# Patient Record
Sex: Female | Born: 1995 | Race: White | Hispanic: No | Marital: Single | Smoking: Never smoker
Health system: Southern US, Community
[De-identification: ages and names within clinical notes are randomized; demographics above are authoritative.]

## PROBLEM LIST (undated history)

## (undated) DIAGNOSIS — E669 Obesity, unspecified: Secondary | ICD-10-CM

## (undated) DIAGNOSIS — R4586 Emotional lability: Secondary | ICD-10-CM

## (undated) DIAGNOSIS — O1203 Gestational edema, third trimester: Secondary | ICD-10-CM

## (undated) DIAGNOSIS — K219 Gastro-esophageal reflux disease without esophagitis: Secondary | ICD-10-CM

## (undated) HISTORY — DX: Gestational edema, third trimester: O12.03

## (undated) HISTORY — DX: Obesity, unspecified: E66.9

## (undated) HISTORY — DX: Emotional lability: R45.86

---
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
## 2015-12-31 ENCOUNTER — Ambulatory Visit
Admission: EM | Admit: 2015-12-31 | Discharge: 2015-12-31 | Disposition: A | Discharge status: Home / Self Care | Payer: Medicaid Other | Attending: Family Medicine | Admitting: Family Medicine

## 2015-12-31 ENCOUNTER — Encounter: Payer: Self-pay | Admitting: *Deleted

## 2015-12-31 DIAGNOSIS — K529 Noninfective gastroenteritis and colitis, unspecified: Secondary | ICD-10-CM | POA: Insufficient documentation

## 2015-12-31 DIAGNOSIS — Z8719 Personal history of other diseases of the digestive system: Secondary | ICD-10-CM

## 2015-12-31 LAB — URINALYSIS COMPLETE WITH MICROSCOPIC (ARMC ONLY)
BILIRUBIN URINE: NEGATIVE
GLUCOSE, UA: NEGATIVE mg/dL
KETONES UR: NEGATIVE mg/dL
LEUKOCYTES UA: NEGATIVE
NITRITE: NEGATIVE
Protein, ur: 30 mg/dL — AB
Specific Gravity, Urine: >1.03 — ABNORMAL HIGH (ref 1.005–1.030)
pH: 5 (ref 5.0–8.0)

## 2015-12-31 MED ORDER — PANTOPRAZOLE SODIUM 20 MG PO TBEC: 20.0000 mg | DELAYED_RELEASE_TABLET | Freq: Every day | ORAL | 0 refills | Status: DC

## 2015-12-31 MED ORDER — ONDANSETRON 8 MG PO TBDP: 8.0000 mg | ORAL_TABLET | Freq: Two times a day (BID) | ORAL | 0 refills | Status: DC

## 2015-12-31 NOTE — ED Triage Notes (Signed)
Hematuria, cough, weakness, N/V,onset today.

## 2015-12-31 NOTE — ED Provider Notes (Signed)
CSN: [id number]     Arrival date & time 12/31/15  1640 History   First MD Initiated Contact with Patient 12/31/15 1712     Chief Complaint  Patient presents with  . Nausea  . Emesis  . Weakness  . Cough  . Hematuria   (Consider location/radiation/quality/duration/timing/severity/associated sxs/prior Treatment) HPI  This a 20 year old female who presents with weakness and nausea and vomiting that started yesterday. She states that she's had 2 episodes of vomiting and diarrhea. The diarrhea has been watery but has had no mucus or blood. Date she has been feeling weak. Cough is been nonproductive. She is also had 2 episodes of some breakthrough menstrual bleeding. She had finished her period last week and noticed some bleeding on her panties and also after wiping herself when she urinated. She states that her 64-year-old son who attends a babysitter on a daily basis with the 2 other children at been sick last week with the diarrhea and vomiting.       History reviewed. No pertinent past medical history. Past Surgical History:  Procedure Laterality Date  . APPENDECTOMY     Family History  Problem Relation Age of Onset  . Cancer Mother   . Seizures Mother    Social History  Substance Use Topics  . Smoking status: Never Smoker  . Smokeless tobacco: Never Used  . Alcohol use No   OB History    No data available     Review of Systems  Constitutional: Positive for activity change and appetite change. Negative for chills, fatigue and fever.  Respiratory: Positive for cough. Negative for shortness of breath.   Gastrointestinal: Positive for diarrhea, nausea and vomiting. Negative for abdominal distention, abdominal pain, anal bleeding, blood in stool, constipation and rectal pain.  Genitourinary: Positive for menstrual problem.  All other systems reviewed and are negative.   Allergies  Review of patient's allergies indicates no known allergies.  Home Medications   Prior to  Admission medications   Medication Sig Start Date End Date Taking? Authorizing Provider  ondansetron (ZOFRAN ODT) 8 MG disintegrating tablet Take 1 tablet (8 mg total) by mouth 2 (two) times daily. For nausea 12/31/15   Lutricia Feil, PA-C  pantoprazole (PROTONIX) 20 MG tablet Take 1 tablet (20 mg total) by mouth daily. 12/31/15   Lutricia Feil, PA-C   Meds Ordered and Administered this Visit  Medications - No data to display  BP 124/80 (BP Location: Left Arm)   Pulse 85   Temp 98.9 F (37.2 C) (Oral)   Resp 16   Ht 5\' 5"  (1.651 m)   Wt 210 lb (95.3 kg)   LMP 12/23/2015   SpO2 97%   BMI 34.95 kg/m  No data found.   Physical Exam  Constitutional: She is oriented to person, place, and time. She appears well-developed and well-nourished. No distress.  HENT:  Head: Normocephalic and atraumatic.  Right Ear: External ear normal.  Left Ear: External ear normal.  Nose: Nose normal.  Mouth/Throat: Oropharynx is clear and moist. No oropharyngeal exudate.  Eyes: EOM are normal. Pupils are equal, round, and reactive to light. Right eye exhibits no discharge. Left eye exhibits no discharge.  Neck: Normal range of motion. Neck supple.  Pulmonary/Chest: Effort normal and breath sounds normal. No respiratory distress. She has no wheezes. She has no rales.  Abdominal: Soft. Bowel sounds are normal. She exhibits no distension and no mass. There is no tenderness. There is no rebound and no guarding.  Musculoskeletal: Normal range of motion.  Lymphadenopathy:    She has no cervical adenopathy.  Neurological: She is alert and oriented to person, place, and time.  Skin: Skin is warm and dry. She is not diaphoretic.  Psychiatric: She has a normal mood and affect. Her behavior is normal. Judgment and thought content normal.  Nursing note and vitals reviewed.   Urgent Care Course   Clinical Course    Procedures (including critical care time)  Labs Review Labs Reviewed  URINALYSIS  COMPLETEWITH MICROSCOPIC ([facility] ONLY) - Abnormal; Notable for the following:       Result Value   APPearance HAZY (*)    Specific Gravity, Urine >1.030 (*)    Hgb urine dipstick TRACE (*)    Protein, ur 30 (*)    Bacteria, UA FEW (*)    Squamous Epithelial / LPF 0-5 (*)    All other components within normal limits    Imaging Review No results found.   Visual Acuity Review  Right Eye Distance:   Left Eye Distance:   Bilateral Distance:    Right Eye Near:   Left Eye Near:    Bilateral Near:         MDM   1. Gastroenteritis, acute   2. History of gastroesophageal reflux (GERD)    Discharge Medication List as of 12/31/2015  5:51 PM    START taking these medications   Details  ondansetron (ZOFRAN ODT) 8 MG disintegrating tablet Take 1 tablet (8 mg total) by mouth 2 (two) times daily. For nausea, Starting Tue 12/31/2015, Normal    pantoprazole (PROTONIX) 20 MG tablet Take 1 tablet (20 mg total) by mouth daily., Starting Tue 12/31/2015, Normal      Plan: 1. Test/x-ray results and diagnosis reviewed with patient 2. rx as per orders; risks, benefits, potential side effects reviewed with patient 3. Recommend supportive treatment with Hydration sufficient to keep her urine clear to a pale yellow. Will prescribed a Zofran to help with nausea. For her spotting I have advised her to watch and see if it clears up. If it doesn't she should go to the GYN clinic that she had attended for the birth of her child. When She does return to eating she should begin with a brat diet. Instructions were given to the patient. If she is not improving in a week or so she should be seen by her primary care physician 4. F/u prn if symptoms worsen or don't improve     Lutricia FeilWilliam P Jeni Duling, PA-C 12/31/15 1820

## 2016-05-08 IMAGING — CT CT ABD-PELV W/ CM
2 of 4 series · 16 of 46 positions shown, 18 images · IV contrast (isovue)
Comparison: None.

CLINICAL DATA: Right lower quadrant pain, anorexia

EXAM:
CT ABDOMEN AND PELVIS WITH CONTRAST
TECHNIQUE: Multidetector CT imaging of the abdomen and pelvis was performed
using the standard protocol following bolus administration of
intravenous contrast.
CONTRAST:  100 cc Isovue

[Series 2: routine abd pel with · axial · 0.71mm/px · z∈[-1150,-700]mm · 13 of 98 slices shown, 15 images]
[im 4/98  soft-tissue]
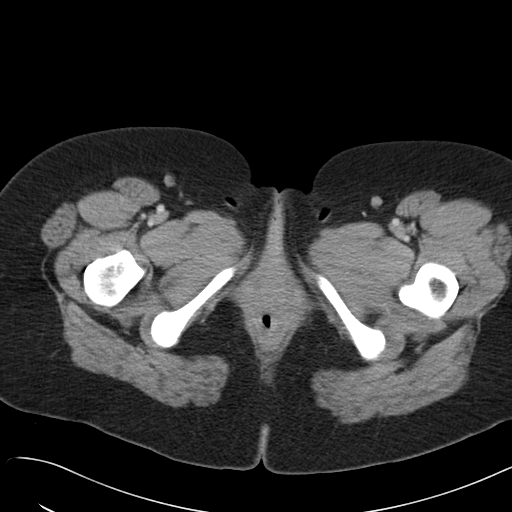
[im 4/98  bone]
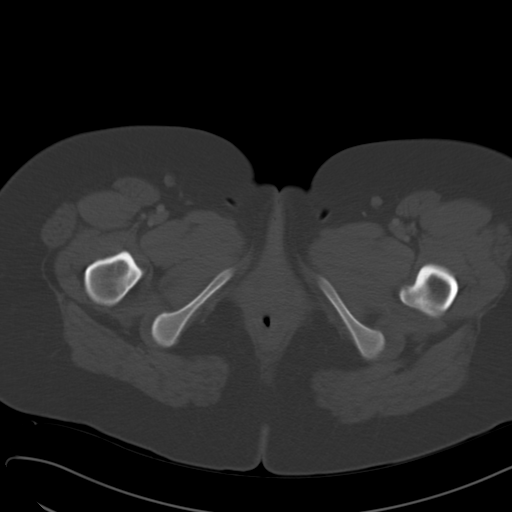
[im 12/98  soft-tissue]
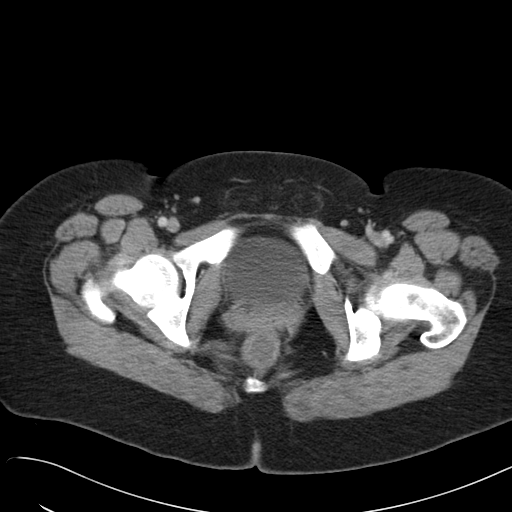
[im 19/98  soft-tissue]
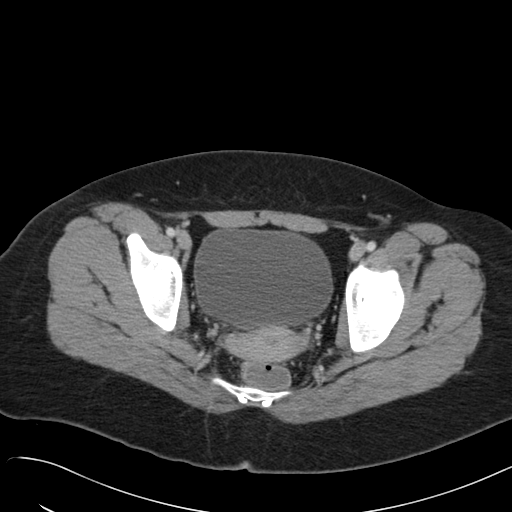
[im 27/98  soft-tissue]
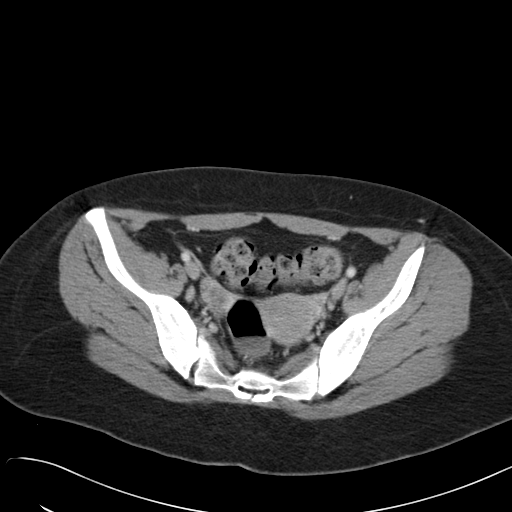
[im 34/98  soft-tissue]
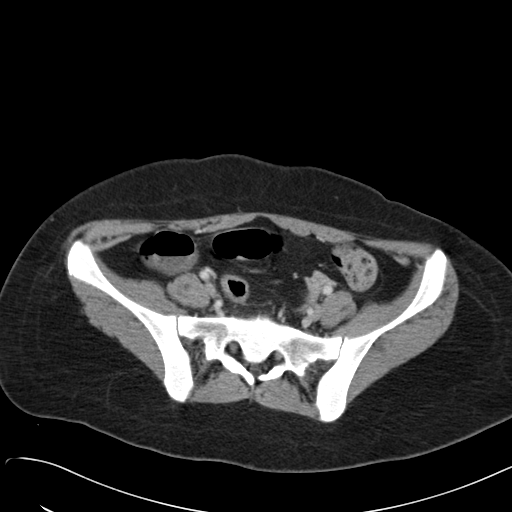
[im 42/98  soft-tissue]
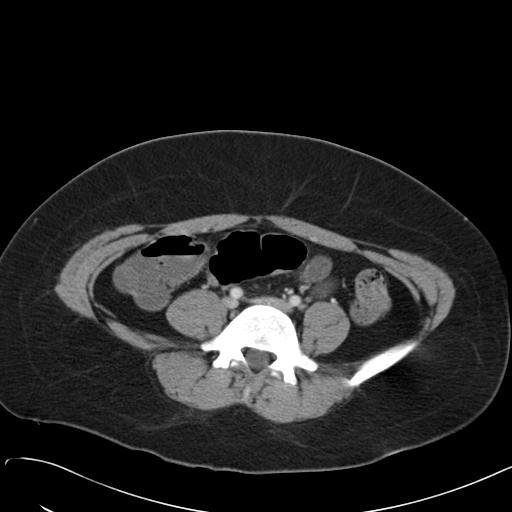
[im 49/98  soft-tissue]
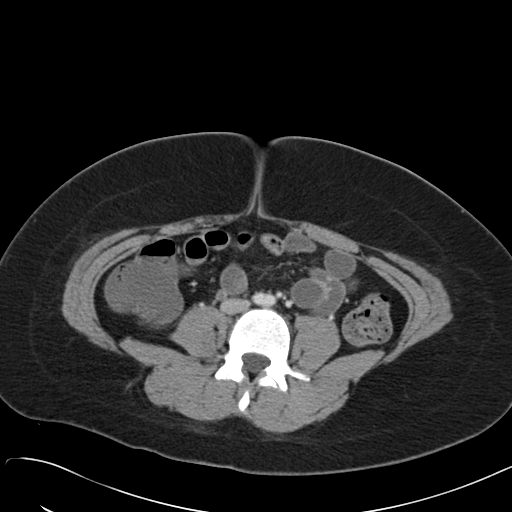
[im 56/98  soft-tissue]
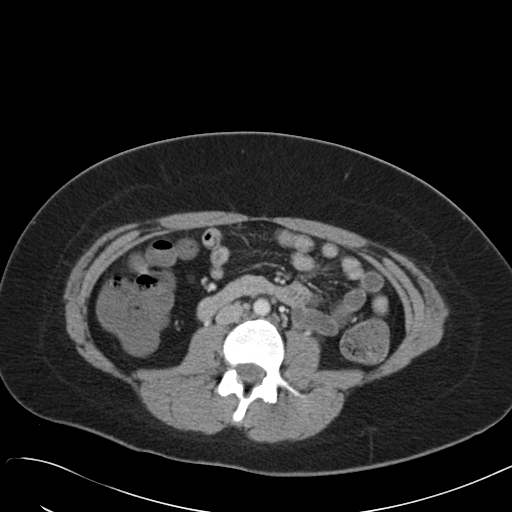
[im 64/98  soft-tissue]
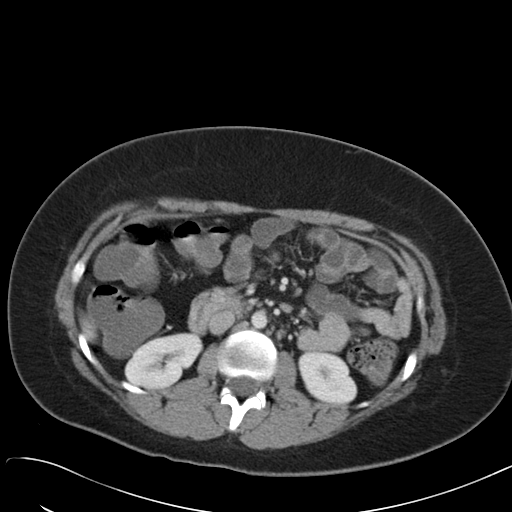
[im 64/98  bone]
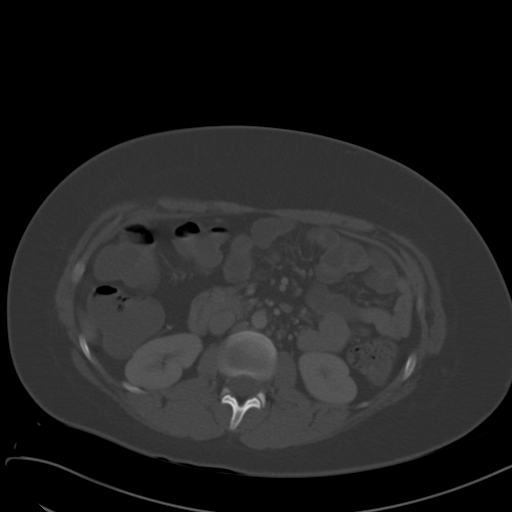
[im 71/98  soft-tissue]
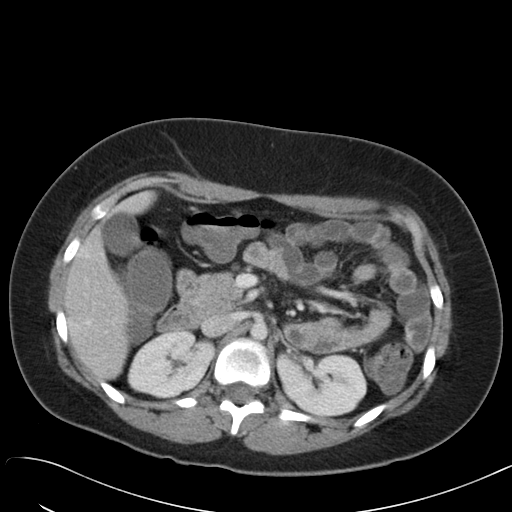
[im 79/98  soft-tissue]
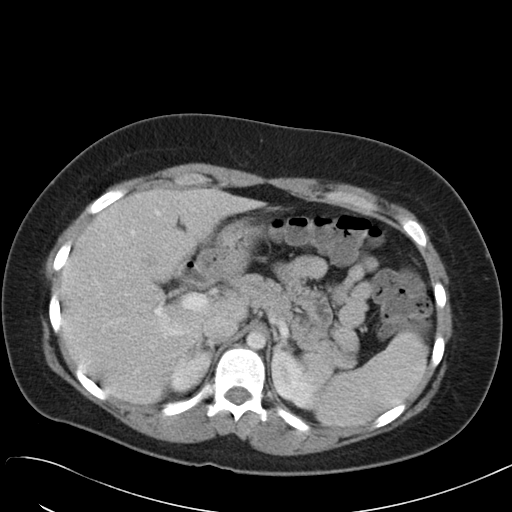
[im 86/98  soft-tissue]
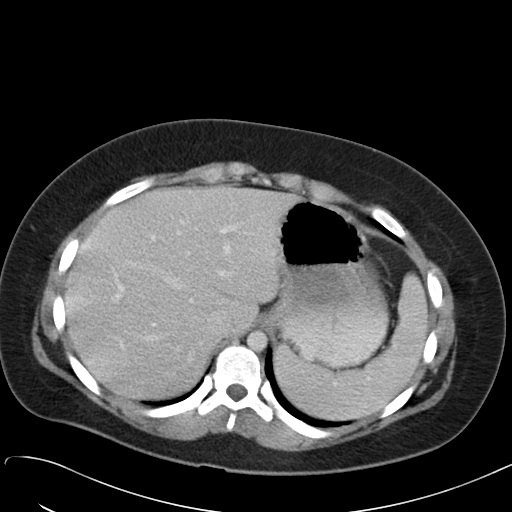
[im 94/98  soft-tissue]
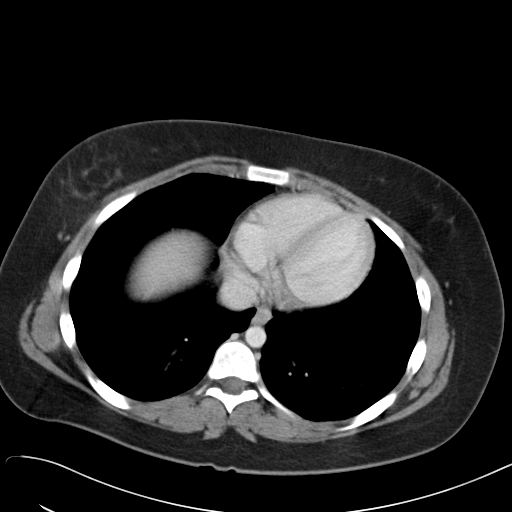

[Series 5: cor routine abd pel with · coronal · 0.75mm/px · 3 of 130 slices shown]
[im 44/130  soft-tissue]
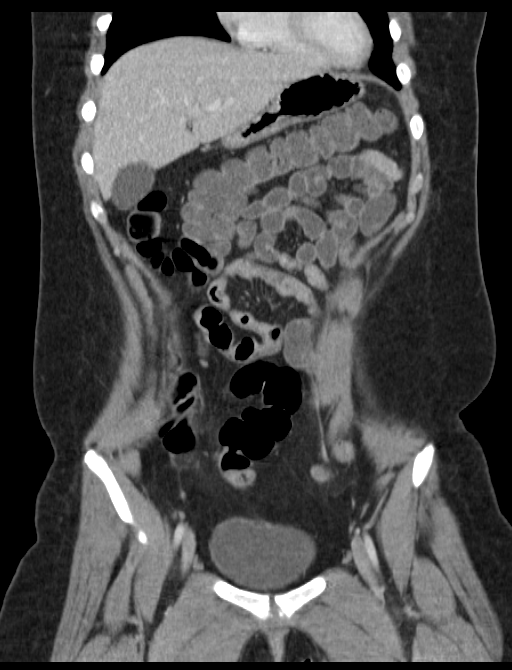
[im 58/130  soft-tissue]
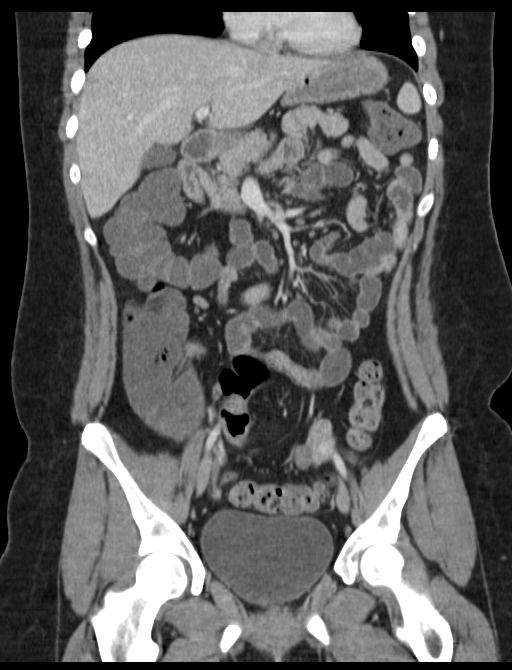
[im 72/130  soft-tissue]
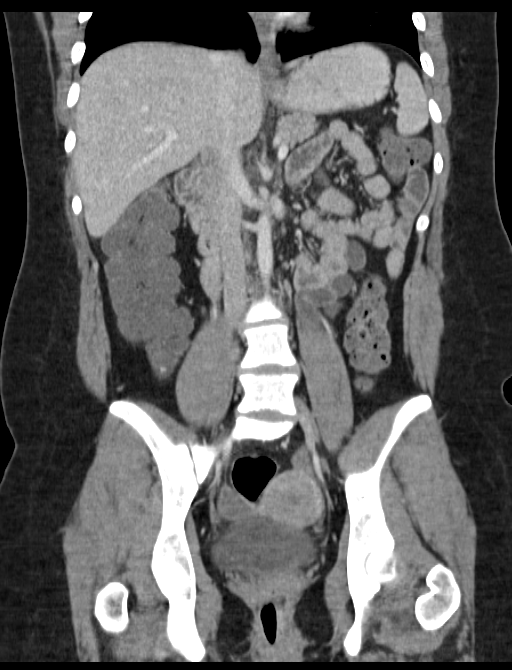

[16 of 46 positions shown; findings below may reference images not displayed]

FINDINGS: Sagittal images of the spine are unremarkable. Liver, spleen,
pancreas and adrenals are unremarkable. Lung bases are unremarkable.
No calcified gallstones are noted within gallbladder. Abdominal
aorta is unremarkable. Kidneys are symmetrical in size and
enhancement. No hydronephrosis or hydroureter. No bowel obstruction.
No ascites or free air. No adenopathy.

In axial image 50 There is subtle enlargement and enhancement of the
tip of the appendix measures about 8 mm in diameter. This is
confirmed in sagittal image 83. The appendix has a retrocecal
position just anterior to right psoas muscle. Tip of the appendix is
same level with umbilicus. The tip of the appendix has a ascending
orientation. There is subtle mild stranding of surrounding fat.
Findings are suspicious for early tip appendicitis. There are right
lower quadrant enlarged lymph nodes the largest in axial image 46
measures 1.4 x 1.1 cm. This may be reactive.

The uterus and adnexa are unremarkable. No adnexal mass. The urinary
bladder is unremarkable. No destructive bony lesions are noted
within pelvis.
IMPRESSION: 1. There is mild thickening of the tip of the appendix and mild
enhancement of the appendiceal wall. The tip of the appendix
measures about 8 mm in diameter. Findings are suspicious for early
tip appendicitis. Clinical correlation is necessary.
2. No hydronephrosis or hydroureter.
3. Unremarkable uterus and adnexa.
4. Mild enlarged lymph nodes right lower quadrant mesentery probable
reactive. Critical Value/emergent results were called by telephone
at the time of interpretation on 10/29/2013 at [DATE] to Dr. POLCZ
YOLENE , who verbally acknowledged these results.

## 2016-07-29 ENCOUNTER — Encounter: Payer: Self-pay | Admitting: *Deleted

## 2016-07-29 ENCOUNTER — Ambulatory Visit
Admission: EM | Admit: 2016-07-29 | Discharge: 2016-07-29 | Disposition: A | Discharge status: Home / Self Care | Payer: BC Managed Care – PPO | Attending: Family Medicine | Admitting: Family Medicine

## 2016-07-29 DIAGNOSIS — N939 Abnormal uterine and vaginal bleeding, unspecified: Secondary | ICD-10-CM

## 2016-07-29 LAB — CBC WITH DIFFERENTIAL/PLATELET
BASOS PCT: 1 %
Basophils Absolute: 0.1 10*3/uL (ref 0–0.1)
Eosinophils Absolute: 0.2 10*3/uL (ref 0–0.7)
Eosinophils Relative: 2 %
HEMATOCRIT: 39.7 % (ref 35.0–47.0)
Hemoglobin: 13 g/dL (ref 12.0–16.0)
Lymphocytes Relative: 26 %
Lymphs Abs: 3.2 10*3/uL (ref 1.0–3.6)
MCH: 25.5 pg — ABNORMAL LOW (ref 26.0–34.0)
MCHC: 32.7 g/dL (ref 32.0–36.0)
MCV: 78.1 fL — ABNORMAL LOW (ref 80.0–100.0)
MONO ABS: 0.6 10*3/uL (ref 0.2–0.9)
Monocytes Relative: 5 %
NEUTROS ABS: 8.1 10*3/uL — AB (ref 1.4–6.5)
NEUTROS PCT: 66 %
Platelets: 340 10*3/uL (ref 150–440)
RBC: 5.09 MIL/uL (ref 3.80–5.20)
RDW: 15 % — AB (ref 11.5–14.5)
WBC: 12.2 10*3/uL — ABNORMAL HIGH (ref 3.6–11.0)

## 2016-07-29 LAB — PREGNANCY, URINE: Preg Test, Ur: NEGATIVE

## 2016-07-29 NOTE — ED Provider Notes (Signed)
[facility]    CSN: [id number] Arrival date & time: 07/29/16  1925     History   Chief Complaint Chief Complaint  Patient presents with  . Vaginal Bleeding    HPI Mariah Wade is a 21 y.o. female.   21 yo female presents with a c/o heavy vaginal bleeding today while at work. States is on her 6th day of her menstrual period but had not this excessive of bleeding where blood was on her underwear and pants. Also states normally her periods only last about 5 days. Patient is sexually active and currently not using contraception.    The history is provided by the patient.  Vaginal Bleeding    History reviewed. No pertinent past medical history.  There are no active problems to display for this patient.   Past Surgical History:  Procedure Laterality Date  . APPENDECTOMY      OB History    No data available       Home Medications    Prior to Admission medications   Medication Sig Start Date End Date Taking? Authorizing Provider  ondansetron (ZOFRAN ODT) 8 MG disintegrating tablet Take 1 tablet (8 mg total) by mouth 2 (two) times daily. For nausea 12/31/15   Lutricia Feil, PA-C  pantoprazole (PROTONIX) 20 MG tablet Take 1 tablet (20 mg total) by mouth daily. 12/31/15   Lutricia Feil, PA-C    Family History Family History  Problem Relation Age of Onset  . Cancer Mother   . Seizures Mother     Social History Social History  Substance Use Topics  . Smoking status: Never Smoker  . Smokeless tobacco: Never Used  . Alcohol use No     Allergies   Patient has no known allergies.   Review of Systems Review of Systems  Genitourinary: Positive for vaginal bleeding.     Physical Exam Triage Vital Signs ED Triage Vitals  Enc Vitals Group     BP 07/29/16 2026 118/67     Pulse Rate 07/29/16 2026 78     Resp 07/29/16 2026 16     Temp 07/29/16 2026 98.7 F (37.1 C)     Temp Source 07/29/16 2026 Oral     SpO2 07/29/16 2026 100 %   Weight 07/29/16 2027 200 lb (90.7 kg)     Height 07/29/16 2027 5\' 5"  (1.651 m)     Head Circumference --      Peak Flow --      Pain Score 07/29/16 2028 3     Pain Loc --      Pain Edu? --      Excl. in GC? --    No data found.   Updated Vital Signs BP 118/67 (BP Location: Left Arm)   Pulse 78   Temp 98.7 F (37.1 C) (Oral)   Resp 16   Ht 5\' 5"  (1.651 m)   Wt 200 lb (90.7 kg)   LMP 07/24/2016   SpO2 100%   BMI 33.28 kg/m   Visual Acuity Right Eye Distance:   Left Eye Distance:   Bilateral Distance:    Right Eye Near:   Left Eye Near:    Bilateral Near:     Physical Exam  Constitutional: She appears well-developed and well-nourished. No distress.  Abdominal: Soft. Bowel sounds are normal. She exhibits no distension and no mass. There is no tenderness. There is no rebound and no guarding.  Skin: She is not diaphoretic.  Nursing note and  vitals reviewed.    UC Treatments / Results  Labs (all labs ordered are listed, but only abnormal results are displayed) Labs Reviewed  CBC WITH DIFFERENTIAL/PLATELET - Abnormal; Notable for the following:       Result Value   WBC 12.2 (*)    MCV 78.1 (*)    MCH 25.5 (*)    RDW 15.0 (*)    Neutro Abs 8.1 (*)    All other components within normal limits  CHLAMYDIA/NGC RT PCR ([facility] ONLY)  PREGNANCY, URINE    EKG  EKG Interpretation None       Radiology No results found.  Procedures Procedures (including critical care time)  Medications Ordered in UC Medications - No data to display   Initial Impression / Assessment and Plan / UC Course  I have reviewed the triage vital signs and the nursing notes.  Pertinent labs & imaging results that were available during my care of the patient were reviewed by me and considered in my medical decision making (see chart for details).       Final Clinical Impressions(s) / UC Diagnoses   Final diagnoses:  Abnormal vaginal bleeding    New  Prescriptions Discharge Medication List as of 07/29/2016  9:22 PM     1. Lab results(negative pregnancy test and normal Hemoglobin/hematocrit) and diagnosis reviewed with patient; explained to patient she may need further testing as outpatient by her OB/GYN, including possible pelvic US; patient currently stable 2. Recommend follow up with her GYN physician in next 1-2 days if symptoms continue 3. Follow-up prn if symptoms worsen or don't improve   Payton Mccallumrlando Abreanna Drawdy, MD 07/29/16 2147

## 2016-07-29 NOTE — ED Triage Notes (Signed)
Patient started having symptom of heavy vaginal bleeding today at work.

## 2016-07-30 LAB — CHLAMYDIA/NGC RT PCR (ARMC ONLY)
Chlamydia Tr: NOT DETECTED
N gonorrhoeae: NOT DETECTED

## 2017-01-27 ENCOUNTER — Encounter: Payer: Self-pay | Admitting: Emergency Medicine

## 2017-01-27 ENCOUNTER — Emergency Department
Admission: EM | Admit: 2017-01-27 | Discharge: 2017-01-27 | Disposition: A | Discharge status: Home / Self Care | Payer: Medicaid Other | Attending: Emergency Medicine | Admitting: Emergency Medicine

## 2017-01-27 DIAGNOSIS — Y939 Activity, unspecified: Secondary | ICD-10-CM | POA: Diagnosis not present

## 2017-01-27 DIAGNOSIS — Z79899 Other long term (current) drug therapy: Secondary | ICD-10-CM | POA: Insufficient documentation

## 2017-01-27 DIAGNOSIS — Y92410 Unspecified street and highway as the place of occurrence of the external cause: Secondary | ICD-10-CM | POA: Insufficient documentation

## 2017-01-27 DIAGNOSIS — Y999 Unspecified external cause status: Secondary | ICD-10-CM | POA: Diagnosis not present

## 2017-01-27 DIAGNOSIS — R51 Headache: Secondary | ICD-10-CM | POA: Diagnosis present

## 2017-01-27 DIAGNOSIS — S060X0A Concussion without loss of consciousness, initial encounter: Secondary | ICD-10-CM

## 2017-01-27 MED ORDER — OXYCODONE-ACETAMINOPHEN 5-325 MG PO TABS
2.0000 | ORAL_TABLET | Freq: Once | ORAL | Status: AC
  Administered 2017-01-27: 2 via ORAL
  Filled 2017-01-27: qty 2

## 2017-01-27 NOTE — Discharge Instructions (Signed)
Please keep your appointment with your neurologist this coming October 10 as scheduled and return to the emergency department sooner for any concerns. Please begin taking over-the-counter magnesium supplementation twice a day to help with headaches.
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
## 2017-04-06 ENCOUNTER — Telehealth: Payer: Self-pay

## 2017-04-06 ENCOUNTER — Encounter: Payer: Self-pay | Admitting: Surgery

## 2017-04-06 ENCOUNTER — Ambulatory Visit: Payer: Self-pay | Admitting: Surgery

## 2017-04-06 VITALS — BP 134/76 | HR 93 | Temp 97.4°F | Ht 65.0 in | Wt 233.5 lb

## 2017-04-06 NOTE — Telephone Encounter (Signed)
Patient called and stated that she does not believe that she will be able to make her rescheduled appointment for 1:30 today due to being in court. She asked if she could get a later time for today. I told her that I would have to speak with the provider and see what time would work best for him. She verbalized understanding.   After speaking with Dr. Aleen CampiPiscoya I called patient back and advised that we will reschedule her for tomorrow at 3:30 [facility] to be seen by Dr. Aleen CampiPiscoya. She verbalized understanding and confirmed appointment.

## 2017-04-06 NOTE — Progress Notes (Signed)
04/06/17  Office New patient visit  Patient presented today for evaluation for removal of subcutaneous birth control device.  Multiple providers have tried to remove it but it is not palpable.  [facility] which shows it's in anteromedial mid upper right arm.  However, there is no ultrasound in our [place] office to locate the device for removal.  She will be rescheduled for this afternoon in our [place] where ultrasound is available for localization and removal in office.  No charge for this visit.  Howie IllJose Luis Laurielle Selmon, MD

## 2017-04-07 ENCOUNTER — Ambulatory Visit: Payer: Self-pay | Admitting: Surgery

## 2017-04-07 ENCOUNTER — Encounter: Payer: Self-pay | Admitting: Surgery

## 2017-04-07 VITALS — BP 126/83 | HR 66 | Temp 98.5°F | Ht 65.0 in | Wt 233.5 lb

## 2017-04-07 DIAGNOSIS — M795 Residual foreign body in soft tissue: Secondary | ICD-10-CM

## 2017-04-07 NOTE — H&P (View-Only) (Signed)
04/07/2017  Reason for Visit:  Removal of foreign body  Referring Provider:  Robyn Wojeck, MD  History of Present Illness: Mariah Wade is a 21 y.o. female with a Nexplanon subdermal contraceptive device in the right upper extremity.  She has had it for 6 months and has not been able to tolerate it and wanted it removed.  She was seen in the ED at [facility] on 11/30 and it was not palpable and even OB/GYN team was not able to remove it.  She was referred for surgery evaluation for removal.  She did have an xray of the right arm which I cannot see via our PACS system, but per reports, it is in anteromedial mid upper right arm.  Past Medical History: Past Medical History:  Diagnosis Date  . Gestational edema in third trimester 10/23/2014   Overview:  Bilateral lower extremity edema with intermittent calf achiness that is present bilaterally and worse at the end of the day. Pain is worse when swelling is worse. No erythema, asymmetry, or palpable cord consistent with blood clot. Will continue to monitor.  7/1: Continued to advise elevation and good PO hydration.  7/8: Unchanged  Last Assessment & Plan:  -- Bilateral lower extremity e  . Mood changes 09/11/2014   Overview:  Patient endorses mood swings over the past several weeks, but states she has good support from FOB. Denies SI/HI or a history of depression or anxiety.  EPDS: 1 -> 1 Will continue to follow clinically and with EPDS.  6/21: Denies mood symptoms. EPDS today 0.  7/1: Denies mood symptoms. EPDS 0.  7/8: Denies mood symptoms. EPDS deferred  Last Assessment & Plan:  Denies mood symptoms. EPDS  . Obesity 05/07/2014   Overview:  Pre-pregnancy weight: 83.915 kg (185 lb) Pre-pregnancy BMI: 31.7 Obesity-related conditions: none  Early glucola 95 28w Glucola: 126  Discussed Total weight gain recommendations no more than 11-20 lbs for pregnancy  7/1: Reviewed weight gain charts. She notes she has been walking. Encouraged.  7/8: Weight remains over  goal. TWG 20.865 kg (46 lb). BMI today 38. If >40 at future visits, b  . Postpartum care following vaginal delivery 05/07/2014   Overview:  Prenatal care: 1. Dating: 13-wk MFM US 2. Blood type: A pos 3. Pap smear: N/A due to age 4. 1 hour GTT:   [x] early GTT = 95  [x] 24-28 wks - 126  5. GBS: neg 6. GC/CT: neg 7. Cephalic confirmed 6/21 with U/S  Vaccinations: 1. Influenza vaccine: s/p on 06/04/14  2. Tdap vaccine: Given 08/27/14 3. Rubella status: immune 4. Varicella status: reports history of disease  Maternal weight gain:      Past Surgical History: Past Surgical History:  Procedure Laterality Date  . APPENDECTOMY      Home Medications: Prior to Admission medications   None    Allergies: No Known Allergies  Social History:  reports that  has never smoked. she has never used smokeless tobacco. She reports that she does not drink alcohol or use drugs.   Family History: Family History  Problem Relation Age of Onset  . Cancer Mother   . Seizures Mother     Review of Systems: Review of Systems  Constitutional: Negative for chills and fever.  HENT: Negative for hearing loss.   Eyes: Negative for blurred vision.  Respiratory: Negative for shortness of breath.   Cardiovascular: Negative for chest pain.  Gastrointestinal: Negative for abdominal pain, nausea and vomiting.  Genitourinary: Negative for   dysuria.  Musculoskeletal: Negative for myalgias.  Skin: Negative for rash.  Neurological: Negative for dizziness.  Psychiatric/Behavioral: Negative for depression.  All other systems reviewed and are negative.   Physical Exam BP 126/83   Pulse 66   Temp 98.5 F (36.9 C) (Oral)   Ht 5\' 5"  (1.651 m)   Wt 105.9 kg (233 lb 8 oz)   BMI 38.86 kg/m  CONSTITUTIONAL: No acute distress HEENT:  Normocephalic, atraumatic, extraocular motion intact. NECK: Trachea is midline, and there is no jugular venous distension.  RESPIRATORY:  Lungs are clear, and breath sounds are equal  bilaterally. Normal respiratory effort without pathologic use of accessory muscles. CARDIOVASCULAR: Heart is regular without murmurs, gallops, or rubs. GI: The abdomen is soft, obese, nondistended, nontender to palpation.  MUSCULOSKELETAL:  Normal muscle strength and tone in all four extremities.  No peripheral edema or cyanosis. SKIN:  Patient has a recent scar, about 3 mm size with surrounding ecchymosis in the posteromedial aspect of mid upper arm, where in ED, attempt had been made to remove device.  I cannot palpate the device in her arm.  Ultrasound was used at bedside to locate the device unsuccessfully.  I personally viewed the ultrasound of her right upper arm, from anterior to posterior throughout, without being able to localize device.  NEUROLOGIC:  Motor and sensation is grossly normal.  Cranial nerves are grossly intact. PSYCH:  Alert and oriented to person, place and time. Affect is normal.  Laboratory Analysis: No results found for this or any previous visit (from the past 24 hour(s)).  Imaging: RUE XR on 11/30 shows linear radiodense foreign body in the anteromedial soft tissues of the mid upper arm. No acute fracture or dislocation. No soft tissue swelling. Elbow and shoulder are approximated  Assessment and Plan: This is a 21 y.o. female who presents for removal of foreign body in her right upper arm.  I am unable to view the xray performed on 11/30 as it is not accessible in our system.  I have independently viewed and performed the ultrasound at bedside which did not find the device as mentioned on xray.  Discussed with patient that at this point, it would be unreasonable to do any incision and removal of device if we cannot localize it better.  Re[facility] and obtain a CD copy of her xray so that we can see it and determine how deep the device is and whether we can remove it in office or would require removal in the OR.  If requires OR, would  tentatively schedule her for 12/19 for right upper arm exploration under flouroscopic guidance with C-arm.  Patient understands this plan and will [facility]ray copy and give it to us.  Further planning depending on location of device.  Face-to-face time spent with the patient and care providers was 60 minutes, with more than 50% of the time spent counseling, educating, and coordinating care of the patient.     Howie IllJose Luis Tashiba Timoney, MD [facility]

## 2017-04-07 NOTE — Patient Instructions (Signed)
Please bring the disc with your xray images to our office tomorrow.   We have scheduled your surgery 04/21/17 at [facility] with Dr.Piscoya. Please see your blue pre-care sheet for surgery information.

## 2017-04-07 NOTE — Progress Notes (Signed)
04/07/2017  Reason for Visit:  Removal of foreign body  Referring Provider:  Donivan Scullobyn Wojeck, MD  History of Present Illness: Mariah Wade is a 21 y.o. female with a Nexplanon subdermal contraceptive device in the right upper extremity.  She has had it for 6 months and has not been able to tolerate it and wanted it removed.  [facility] and it was not palpable and even OB/GYN team was not able to remove it.  She was referred for surgery evaluation for removal.  She did have an xray of the right arm which I cannot see via our PACS system, but per reports, it is in anteromedial mid upper right arm.  Past Medical History: Past Medical History:  Diagnosis Date  . Gestational edema in third trimester 10/23/2014   Overview:  Bilateral lower extremity edema with intermittent calf achiness that is present bilaterally and worse at the end of the day. Pain is worse when swelling is worse. No erythema, asymmetry, or palpable cord consistent with blood clot. Will continue to monitor.  7/1: Continued to advise elevation and good PO hydration.  7/8: Unchanged  Last Assessment & Plan:  -- Bilateral lower extremity e  . Mood changes 09/11/2014   Overview:  Patient endorses mood swings over the past several weeks, but states she has good support from FOB. Denies SI/HI or a history of depression or anxiety.  EPDS: 1 -> 1 Will continue to follow clinically and with EPDS.  6/21: Denies mood symptoms. EPDS today 0.  7/1: Denies mood symptoms. EPDS 0.  7/8: Denies mood symptoms. EPDS deferred  Last Assessment & Plan:  Denies mood symptoms. EPDS  . Obesity 05/07/2014   Overview:  Pre-pregnancy weight: 83.915 kg (185 lb) Pre-pregnancy BMI: 31.7 Obesity-related conditions: none  Early glucola 95 28w Glucola: 126  Discussed Total weight gain recommendations no more than 11-20 lbs for pregnancy  7/1: Reviewed weight gain charts. She notes she has been walking. Encouraged.  7/8: Weight remains over  goal. TWG 20.865 kg (46 lb). BMI today 38. If >40 at future visits, b  . Postpartum care following vaginal delivery 05/07/2014   Overview:  Prenatal care: 1. Dating: 13-wk MFM US 2. Blood type: A pos 3. Pap smear: N/A due to age 784. 1 hour GTT:   [x]  early GTT = 95  [x]  24-28 wks - 126  5. GBS: neg 6. GC/CT: neg 7. Cephalic confirmed 6/21 with U/S  Vaccinations: 1. Influenza vaccine: s/p on 06/04/14  2. Tdap vaccine: Given 08/27/14 3. Rubella status: immune 4. Varicella status: reports history of disease  Maternal weight gain:      Past Surgical History: Past Surgical History:  Procedure Laterality Date  . APPENDECTOMY      Home Medications: Prior to Admission medications   None    Allergies: No Known Allergies  Social History:  reports that  has never smoked. she has never used smokeless tobacco. She reports that she does not drink alcohol or use drugs.   Family History: Family History  Problem Relation Age of Onset  . Cancer Mother   . Seizures Mother     Review of Systems: Review of Systems  Constitutional: Negative for chills and fever.  HENT: Negative for hearing loss.   Eyes: Negative for blurred vision.  Respiratory: Negative for shortness of breath.   Cardiovascular: Negative for chest pain.  Gastrointestinal: Negative for abdominal pain, nausea and vomiting.  Genitourinary: Negative for  dysuria.  Musculoskeletal: Negative for myalgias.  Skin: Negative for rash.  Neurological: Negative for dizziness.  Psychiatric/Behavioral: Negative for depression.  All other systems reviewed and are negative.   Physical Exam BP 126/83   Pulse 66   Temp 98.5 F (36.9 C) (Oral)   Ht 5\' 5"  (1.651 m)   Wt 105.9 kg (233 lb 8 oz)   BMI 38.86 kg/m  CONSTITUTIONAL: No acute distress HEENT:  Normocephalic, atraumatic, extraocular motion intact. NECK: Trachea is midline, and there is no jugular venous distension.  RESPIRATORY:  Lungs are clear, and breath sounds are equal  bilaterally. Normal respiratory effort without pathologic use of accessory muscles. CARDIOVASCULAR: Heart is regular without murmurs, gallops, or rubs. GI: The abdomen is soft, obese, nondistended, nontender to palpation.  MUSCULOSKELETAL:  Normal muscle strength and tone in all four extremities.  No peripheral edema or cyanosis. SKIN:  Patient has a recent scar, about 3 mm size with surrounding ecchymosis in the posteromedial aspect of mid upper arm, where in ED, attempt had been made to remove device.  I cannot palpate the device in her arm.  Ultrasound was used at bedside to locate the device unsuccessfully.  I personally viewed the ultrasound of her right upper arm, from anterior to posterior throughout, without being able to localize device.  NEUROLOGIC:  Motor and sensation is grossly normal.  Cranial nerves are grossly intact. PSYCH:  Alert and oriented to person, place and time. Affect is normal.  Laboratory Analysis: No results found for this or any previous visit (from the past 24 hour(s)).  Imaging: RUE XR on 11/30 shows linear radiodense foreign body in the anteromedial soft tissues of the mid upper arm. No acute fracture or dislocation. No soft tissue swelling. Elbow and shoulder are approximated  Assessment and Plan: This is a 21 y.o. female who presents for removal of foreign body in her right upper arm.  I am unable to view the xray performed on 11/30 as it is not accessible in our system.  I have independently viewed and performed the ultrasound at bedside which did not find the device as mentioned on xray.  Discussed with patient that at this point, it would be unreasonable to do any incision and removal of device if we cannot localize it better.  Re[facility] and obtain a CD copy of her xray so that we can see it and determine how deep the device is and whether we can remove it in office or would require removal in the OR.  If requires OR, would  tentatively schedule her for 12/19 for right upper arm exploration under flouroscopic guidance with C-arm.  Patient understands this plan and will [facility]ray copy and give it to us.  Further planning depending on location of device.  Face-to-face time spent with the patient and care providers was 60 minutes, with more than 50% of the time spent counseling, educating, and coordinating care of the patient.     Howie IllJose Luis Clair Bardwell, MD [facility]

## 2017-04-13 ENCOUNTER — Telehealth: Payer: Self-pay | Admitting: Surgery

## 2017-04-13 NOTE — Telephone Encounter (Signed)
Pt advised of pre op date/time and sx date. Sx: 04/21/17 with Dr Sherlynn StallsPiscoya--Foreign body removal of contraceptive. Right arm.  Pre op: 04/14/17 between 9-1:00pm--Phone interview.   Patient made aware to call [phone], between 1-3:00pm the day before surgery, to find out what time to arrive.    Patient has agreed to pay 100.00 prior to surgery for a deposit.

## 2017-04-14 ENCOUNTER — Encounter
Admission: RE | Admit: 2017-04-14 | Discharge: 2017-04-14 | Disposition: A | Discharge status: Home / Self Care | Payer: No Typology Code available for payment source | Source: Ambulatory Visit | Attending: Surgery | Admitting: Surgery

## 2017-04-14 ENCOUNTER — Other Ambulatory Visit: Payer: Self-pay

## 2017-04-14 HISTORY — DX: Gastro-esophageal reflux disease without esophagitis: K21.9

## 2017-04-14 NOTE — Patient Instructions (Signed)
  Your procedure is scheduled on: 04-21-17  Report to Same Day Surgery 2nd floor medical mall [facility](Medical Mall Entrance-take elevator on left to 2nd floor.  Check in with surgery information desk.) To find out your arrival time please call [phone] between 1PM - 3PM on 04-20-17  Remember: Instructions that are not followed completely may result in serious medical risk, up to and including death, or upon the discretion of your surgeon and anesthesiologist your surgery may need to be rescheduled.    _x___ 1. Do not eat food after midnight the night before your procedure. You may drink clear liquids up to 2 hours before you are scheduled to arrive at the hospital for your procedure.  Do not drink clear liquids within 2 hours of your scheduled arrival to the hospital.  Clear liquids include  --Water or Apple juice without pulp  --Clear carbohydrate beverage such as ClearFast or Gatorade  --Black Coffee or Clear Tea (No milk, no creamers, do not add anything to the coffee or Tea   No gum chewing or hard candies.     __x__ 2. No Alcohol for 24 hours before or after surgery.   __x__3. No Smoking for 24 prior to surgery.   ____  4. Bring all medications with you on the day of surgery if instructed.    __x__ 5. Notify your doctor if there is any change in your medical condition     (cold, fever, infections).     Do not wear jewelry, make-up, hairpins, clips or nail polish.  Do not wear lotions, powders, or perfumes. You may wear deodorant.  Do not shave 48 hours prior to surgery. Men may shave face and neck.  Do not bring valuables to the hospital.    [facility] is not responsible for any belongings or valuables.               Contacts, dentures or bridgework may not be worn into surgery.  Leave your suitcase in the car. After surgery it may be brought to your room.  For patients admitted to the hospital, discharge time is determined by your treatment team.   Patients discharged the day  of surgery will not be allowed to drive home.  You will need someone to drive you home and stay with you the night of your procedure.      _x___ TAKE THE FOLLOWING MEDICATION THE MORNING OF SURGERY WITH SMALL SIP OF WATER. These include:  1. ZANTAC  2. TAKE A ZANTAC Tuesday NIGHT BEFORE BED  3.  4.  5.  6.  ____Fleets enema or Magnesium Citrate as directed.   ____ Use CHG Soap or sage wipes as directed on instruction sheet   ____ Use inhalers on the day of surgery and bring to hospital day of surgery  ____ Stop Metformin and Janumet 2 days prior to surgery.    ____ Take 1/2 of usual insulin dose the night before surgery and none on the morning  surgery.   ____ Follow recommendations from Cardiologist, Pulmonologist or PCP regarding stopping Aspirin, Coumadin, Plavix ,Eliquis, Effient, or Pradaxa, and Pletal.  X____Stop Anti-inflammatories such as Advil, Aleve, Ibuprofen, Motrin, Naproxen, Naprosyn, Goodies powders or aspirin products NOW- OK to take Tylenol    ____ Stop supplements until after surgery.   ____ Bring C-Pap to the hospital.

## 2017-04-19 ENCOUNTER — Telehealth: Payer: Self-pay | Admitting: Surgery

## 2017-04-19 NOTE — Telephone Encounter (Signed)
Patient called and made her surgery deposit.

## 2017-04-20 MED ORDER — CEFAZOLIN SODIUM-DEXTROSE 2-4 GM/100ML-% IV SOLN
2.0000 g | INTRAVENOUS | Status: AC
  Administered 2017-04-21: 2 g via INTRAVENOUS

## 2017-04-21 ENCOUNTER — Encounter
Admission: RE | Disposition: A | Discharge status: Home / Self Care | Payer: Self-pay | Source: Ambulatory Visit | Attending: Surgery

## 2017-04-21 ENCOUNTER — Ambulatory Visit: Payer: Medicaid Other | Admitting: Anesthesiology

## 2017-04-21 ENCOUNTER — Other Ambulatory Visit: Payer: Self-pay

## 2017-04-21 ENCOUNTER — Ambulatory Visit
Admission: RE | Admit: 2017-04-21 | Discharge: 2017-04-21 | Disposition: A | Discharge status: Home / Self Care | Payer: Medicaid Other | Source: Ambulatory Visit | Attending: Surgery | Admitting: Surgery

## 2017-04-21 ENCOUNTER — Encounter: Payer: Self-pay | Admitting: *Deleted

## 2017-04-21 ENCOUNTER — Ambulatory Visit: Payer: Medicaid Other

## 2017-04-21 DIAGNOSIS — S40859A Superficial foreign body of unspecified upper arm, initial encounter: Secondary | ICD-10-CM

## 2017-04-21 DIAGNOSIS — Z4589 Encounter for adjustment and management of other implanted devices: Secondary | ICD-10-CM | POA: Insufficient documentation

## 2017-04-21 DIAGNOSIS — E669 Obesity, unspecified: Secondary | ICD-10-CM | POA: Insufficient documentation

## 2017-04-21 DIAGNOSIS — R600 Localized edema: Secondary | ICD-10-CM | POA: Insufficient documentation

## 2017-04-21 DIAGNOSIS — S40851A Superficial foreign body of right upper arm, initial encounter: Secondary | ICD-10-CM

## 2017-04-21 DIAGNOSIS — K219 Gastro-esophageal reflux disease without esophagitis: Secondary | ICD-10-CM | POA: Diagnosis not present

## 2017-04-21 DIAGNOSIS — Z6838 Body mass index (BMI) 38.0-38.9, adult: Secondary | ICD-10-CM | POA: Diagnosis not present

## 2017-04-21 DIAGNOSIS — I10 Essential (primary) hypertension: Secondary | ICD-10-CM | POA: Insufficient documentation

## 2017-04-21 DIAGNOSIS — Z809 Family history of malignant neoplasm, unspecified: Secondary | ICD-10-CM | POA: Insufficient documentation

## 2017-04-21 DIAGNOSIS — Z820 Family history of epilepsy and other diseases of the nervous system: Secondary | ICD-10-CM | POA: Insufficient documentation

## 2017-04-21 HISTORY — PX: FOREIGN BODY REMOVAL: SHX962

## 2017-04-21 LAB — POCT PREGNANCY, URINE: PREG TEST UR: NEGATIVE

## 2017-04-21 SURGERY — FOREIGN BODY REMOVAL ADULT
Anesthesia: General | Laterality: Right

## 2017-04-21 MED ORDER — ACETAMINOPHEN 500 MG PO TABS
ORAL_TABLET | ORAL | Status: AC
  Filled 2017-04-21: qty 2

## 2017-04-21 MED ORDER — FENTANYL CITRATE (PF) 100 MCG/2ML IJ SOLN
INTRAMUSCULAR | Status: DC | PRN
  Administered 2017-04-21 (×2): 25 ug via INTRAVENOUS

## 2017-04-21 MED ORDER — KETOROLAC TROMETHAMINE 30 MG/ML IJ SOLN
INTRAMUSCULAR | Status: DC | PRN
  Administered 2017-04-21: 30 mg via INTRAVENOUS

## 2017-04-21 MED ORDER — OXYCODONE HCL 5 MG PO TABS: 5.0000 mg | ORAL_TABLET | Freq: Four times a day (QID) | ORAL | 0 refills | Status: AC | PRN

## 2017-04-21 MED ORDER — PROPOFOL 500 MG/50ML IV EMUL
INTRAVENOUS | Status: AC
  Filled 2017-04-21: qty 50

## 2017-04-21 MED ORDER — LIDOCAINE HCL (PF) 2 % IJ SOLN
INTRAMUSCULAR | Status: AC
  Filled 2017-04-21: qty 10

## 2017-04-21 MED ORDER — IBUPROFEN 600 MG PO TABS: 600.0000 mg | ORAL_TABLET | Freq: Three times a day (TID) | ORAL | 0 refills | Status: AC | PRN

## 2017-04-21 MED ORDER — PROPOFOL 500 MG/50ML IV EMUL
INTRAVENOUS | Status: DC | PRN
  Administered 2017-04-21: 120 ug/kg/min via INTRAVENOUS

## 2017-04-21 MED ORDER — BUPIVACAINE-EPINEPHRINE (PF) 0.5% -1:200000 IJ SOLN
INTRAMUSCULAR | Status: AC
  Filled 2017-04-21: qty 30

## 2017-04-21 MED ORDER — MIDAZOLAM HCL 2 MG/2ML IJ SOLN
INTRAMUSCULAR | Status: DC | PRN
  Administered 2017-04-21: 2 mg via INTRAVENOUS

## 2017-04-21 MED ORDER — CEFAZOLIN SODIUM-DEXTROSE 2-4 GM/100ML-% IV SOLN
INTRAVENOUS | Status: AC
  Filled 2017-04-21: qty 100

## 2017-04-21 MED ORDER — GABAPENTIN 300 MG PO CAPS
ORAL_CAPSULE | ORAL | Status: AC
  Filled 2017-04-21: qty 1

## 2017-04-21 MED ORDER — LIDOCAINE HCL (CARDIAC) 20 MG/ML IV SOLN
INTRAVENOUS | Status: DC | PRN
  Administered 2017-04-21: 40 mg via INTRAVENOUS

## 2017-04-21 MED ORDER — FENTANYL CITRATE (PF) 100 MCG/2ML IJ SOLN
INTRAMUSCULAR | Status: AC
  Filled 2017-04-21: qty 2

## 2017-04-21 MED ORDER — CHLORHEXIDINE GLUCONATE CLOTH 2 % EX PADS: 6.0000 | MEDICATED_PAD | Freq: Once | CUTANEOUS | Status: DC

## 2017-04-21 MED ORDER — GABAPENTIN 300 MG PO CAPS
300.0000 mg | ORAL_CAPSULE | ORAL | Status: AC
  Administered 2017-04-21: 300 mg via ORAL

## 2017-04-21 MED ORDER — PROPOFOL 10 MG/ML IV BOLUS
INTRAVENOUS | Status: AC
  Filled 2017-04-21: qty 20

## 2017-04-21 MED ORDER — MIDAZOLAM HCL 2 MG/2ML IJ SOLN
INTRAMUSCULAR | Status: AC
  Filled 2017-04-21: qty 2

## 2017-04-21 MED ORDER — LACTATED RINGERS IV SOLN
INTRAVENOUS | Status: DC
  Administered 2017-04-21: 09:00:00 via INTRAVENOUS

## 2017-04-21 MED ORDER — ACETAMINOPHEN 500 MG PO TABS
1000.0000 mg | ORAL_TABLET | ORAL | Status: AC
  Administered 2017-04-21: 1000 mg via ORAL

## 2017-04-21 MED ORDER — PROPOFOL 10 MG/ML IV BOLUS
INTRAVENOUS | Status: DC | PRN
  Administered 2017-04-21 (×2): 40 mg via INTRAVENOUS

## 2017-04-21 MED ORDER — CHLORHEXIDINE GLUCONATE CLOTH 2 % EX PADS
6.0000 | MEDICATED_PAD | Freq: Once | CUTANEOUS | Status: AC
  Administered 2017-04-21: 6 via TOPICAL

## 2017-04-21 MED ORDER — BUPIVACAINE-EPINEPHRINE 0.5% -1:200000 IJ SOLN
INTRAMUSCULAR | Status: DC | PRN
  Administered 2017-04-21: 10 mL
  Administered 2017-04-21: 30 mL

## 2017-04-21 SURGICAL SUPPLY — 22 items
BNDG GAUZE 4.5X4.1 6PLY STRL (MISCELLANEOUS) ×2 IMPLANT
CHLORAPREP W/TINT 26ML (MISCELLANEOUS) ×2 IMPLANT
DERMABOND ADVANCED (GAUZE/BANDAGES/DRESSINGS) ×1
DERMABOND ADVANCED .7 DNX12 (GAUZE/BANDAGES/DRESSINGS) ×1 IMPLANT
DRAIN PENROSE 1/4X12 LTX (DRAIN) ×2 IMPLANT
DRAPE SHEET LG 3/4 BI-LAMINATE (DRAPES) ×2 IMPLANT
ELECT REM PT RETURN 9FT ADLT (ELECTROSURGICAL) ×2
ELECTRODE REM PT RTRN 9FT ADLT (ELECTROSURGICAL) ×1 IMPLANT
GAUZE SPONGE 4X4 12PLY STRL (GAUZE/BANDAGES/DRESSINGS) ×2 IMPLANT
GLOVE SURG SYN 7.0 (GLOVE) ×2 IMPLANT
GLOVE SURG SYN 7.5  E (GLOVE) ×1
GLOVE SURG SYN 7.5 E (GLOVE) ×1 IMPLANT
GOWN STRL REUS W/ TWL LRG LVL3 (GOWN DISPOSABLE) ×2 IMPLANT
GOWN STRL REUS W/TWL LRG LVL3 (GOWN DISPOSABLE) ×2
KIT RM TURNOVER STRD PROC AR (KITS) ×2 IMPLANT
LABEL OR SOLS (LABEL) ×2 IMPLANT
NS IRRIG 500ML POUR BTL (IV SOLUTION) ×2 IMPLANT
PACK BASIN MINOR ARMC (MISCELLANEOUS) ×2 IMPLANT
SUT MNCRL AB 4-0 PS2 18 (SUTURE) ×2 IMPLANT
SUT VIC AB 3-0 SH 27 (SUTURE) ×1
SUT VIC AB 3-0 SH 27X BRD (SUTURE) ×1 IMPLANT
SWAB CULTURE AMIES ANAERIB BLU (MISCELLANEOUS) ×2 IMPLANT

## 2017-04-21 NOTE — Interval H&P Note (Signed)
History and Physical Interval Note:  04/21/2017 10:02 AM  Katera L Ky  has presented today for surgery, with the diagnosis of foreign body in soft tissue of right upper arm  The various methods of treatment have been discussed with the patient and family. After consideration of risks, benefits and other options for treatment, the patient has consented to  Procedure(s) with comments: FOREIGN BODY REMOVAL ADULT (Right) - right upper arm foreign body excision.  Will need c-arm for fluoroscopy during procedure to localize foreign body. as a surgical intervention .  The patient's history has been reviewed, patient examined, no change in status, stable for surgery.  I have reviewed the patient's chart and labs.  Questions were answered to the patient's satisfaction.     Jes Costales

## 2017-04-21 NOTE — Anesthesia Preprocedure Evaluation (Addendum)
Anesthesia Evaluation  Patient identified by MRN, date of birth, ID band Patient awake    Reviewed: Allergy & Precautions, NPO status , Patient's Chart, lab work & pertinent test results  Airway Mallampati: II  TM Distance: >3 FB     Dental  (+) Teeth Intact   Pulmonary neg pulmonary ROS,    Pulmonary exam normal        Cardiovascular hypertension, Normal cardiovascular exam     Neuro/Psych negative neurological ROS  negative psych ROS   GI/Hepatic Neg liver ROS, GERD  Medicated and Controlled,  Endo/Other  negative endocrine ROS  Renal/GU negative Renal ROS     Musculoskeletal negative musculoskeletal ROS (+)   Abdominal Normal abdominal exam  (+)   Peds  Hematology negative hematology ROS (+)   Anesthesia Other Findings Past Medical History: No date: GERD (gastroesophageal reflux disease)     Comment:  OCC- 10/23/2014: Gestational edema in third trimester     Comment:  Overview:  Bilateral lower extremity edema with               intermittent calf achiness that is present bilaterally               and worse at the end of the day. Pain is worse when               swelling is worse. No erythema, asymmetry, or palpable               cord consistent with blood clot. Will continue to               monitor.  7/1: Continued to advise elevation and good PO               hydration.  7/8: Unchanged  Last Assessment & Plan:  --               Bilateral lower extremity e 09/11/2014: Mood changes     Comment:  Overview:  Patient endorses mood swings over the past               several weeks, but states she has good support from FOB.               Denies SI/HI or a history of depression or anxiety.                EPDS: 1 -> 1 Will continue to follow clinically and with               EPDS.  6/21: Denies mood symptoms. EPDS today 0.  7/1:               Denies mood symptoms. EPDS 0.  7/8: Denies mood symptoms.               EPDS deferred  Last Assessment & Plan:  Denies mood               symptoms. EPDS 05/07/2014: Obesity     Comment:  Overview:  Pre-pregnancy weight: 83.915 kg (185 lb)               Pre-pregnancy BMI: 31.7 Obesity-related conditions: none               Early glucola 95 28w Glucola: 126  Discussed Total weight              gain recommendations no more than 11-20 lbs for pregnancy  7/1: Reviewed weight gain charts. She notes she has been               walking. Encouraged.  7/8: Weight remains over goal. TWG               20.865 kg (46 lb). BMI today 38. If >40 at future visits,              b 05/07/2014: Postpartum care following vaginal delivery     Comment:  Overview:  Prenatal care: 1. Dating: 13-wk MFM [place] 2.               Blood type: A pos 3. Pap smear: N/A due to age 65. 1 hour               GTT:   (x) early GTT = 95  (x) 24-28 wks - 126  5. GBS:               neg 6. GC/CT: neg 7. Cephalic confirmed 6/57 with U/S                Vaccinations: 1. Influenza vaccine: s/p on 06/04/14  2.               Tdap vaccine: Given 08/27/14 3. Rubella status: immune 4.               Varicella status: reports history of disease  Maternal               weight gain:   Reproductive/Obstetrics                             Anesthesia Physical Anesthesia Plan  ASA: II  Anesthesia Plan: MAC   Post-op Pain Management:    Induction:   PONV Risk Score and Plan:   Airway Management Planned:   Additional Equipment:   Intra-op Plan:   Post-operative Plan: Extubation in OR  Informed Consent: I have reviewed the patients History and Physical, chart, labs and discussed the procedure including the risks, benefits and alternatives for the proposed anesthesia with the patient or authorized representative who has indicated his/her understanding and acceptance.   Dental advisory given  Plan Discussed with: CRNA and Surgeon  Anesthesia Plan Comments:        Anesthesia  Quick Evaluation

## 2017-04-21 NOTE — Transfer of Care (Signed)
Immediate Anesthesia Transfer of Care Note  Patient: Mariah Wade  Procedure(s) Performed: FOREIGN BODY REMOVAL ADULT (Right )  Patient Location: PACU  Anesthesia Type:MAC  Level of Consciousness: sedated  Airway & Oxygen Therapy: Patient Spontanous Breathing and Patient connected to nasal cannula oxygen  Post-op Assessment: Report given to RN and Post -op Vital signs reviewed and stable  Post vital signs: Reviewed and stable  Last Vitals:  Vitals:   04/21/17 0852 04/21/17 1239  BP: 122/73 (P) 114/81  Pulse: 97 (P) 79  Resp: 15 (!) (P) 21  Temp: 36.5 C (!) (P) 36.2 C  SpO2: 99% (P) 100%    Last Pain:  Vitals:   04/21/17 0852  TempSrc: Oral         Complications: No apparent anesthesia complications

## 2017-04-21 NOTE — Op Note (Signed)
  Procedure Date:  04/21/2017  Pre-operative Diagnosis:   Contraceptive device of right upper arm  Post-operative Diagnosis:  Contraceptive device of right upper arm  Procedure:  Localization of contraceptive device with fluoroscopy, excision of contraceptive device of right upper arm  Surgeon:  Howie IllJose Luis Opel Lejeune, MD  Anesthesia:  General endotracheal  Estimated Blood Loss:  3 ml  Specimens:  Contraceptive device  Complications:  None  Indications for Procedure:  This is a 21 y.o. female who underwent placement of a contraceptive device in the right upper arm about 6 months ago.  She is unable to tolerate the device but upon trying to remove it, her doctor could not find it.  It was not localized on ultrasound and it was decided to go to OR for localization and excision.  The risks of bleeding, abscess or infection, injury to surrounding structures, and need for further procedures were all discussed with the patient and was willing to proceed.  Description of Procedure: The patient was correctly identified in the preoperative area and brought into the operating room.  The patient was placed supine with VTE prophylaxis in place.  Appropriate time-outs were performed.  Anesthesia was induced and the patient was sedated.  Appropriate antibiotics were infused.  The C-arm was used first to localize the contraceptive device.  This was localized to the upper inner medial arm, near the axilla.  The patient's right upper arm and axilla were prepped and draped in usual sterile fashion.  With all drapes in place, the C-arm was used again to confirm location of device.  Local anesthetic was infused intradermally.  A 5 cm incision was made over the location of the device.  Cautery was used to dissect down the subcutaneous tissue.  The contraceptive device was found about 1 inch deep to the skin and removed intact.  Another x-ray was obtain to confirm complete removal.  More local anesthetic was infused and  the cavity was irrigated thoroughly.  The wound was closed in two layers using 3-0 Vicryl and 4-0 Monocryl.  The wound was cleaned and sealed with DermaBond.  The patient was emerged from anesthesia and transferred to the recovery room for further management.  The patient tolerated the procedure well and all counts were correct at the end of the case.   Howie IllJose Luis Reynold Mantell, MD

## 2017-04-21 NOTE — Anesthesia Postprocedure Evaluation (Signed)
Anesthesia Post Note  Patient: Mariah Wade  Procedure(s) Performed: FOREIGN BODY REMOVAL ADULT (Right )  Patient location during evaluation: PACU Anesthesia Type: General Level of consciousness: awake and alert Pain management: pain level controlled Vital Signs Assessment: post-procedure vital signs reviewed and stable Respiratory status: spontaneous breathing, nonlabored ventilation, respiratory function stable and patient connected to nasal cannula oxygen Cardiovascular status: blood pressure returned to baseline and stable Postop Assessment: no apparent nausea or vomiting Anesthetic complications: no     Last Vitals:  Vitals:   04/21/17 1323 04/21/17 1350  BP: (!) 93/52 (!) 98/50  Pulse: 60 77  Resp: 16   Temp: 36.7 C   SpO2: 100% 100%    Last Pain:  Vitals:   04/21/17 1350  TempSrc:   PainSc: 0-No pain                 Darden Flemister S

## 2017-04-21 NOTE — Anesthesia Procedure Notes (Signed)
Procedure Name: MAC Performed by: Lance Muss, CRNA Pre-anesthesia Checklist: Patient identified, Emergency Drugs available, Suction available, Patient being monitored and Timeout performed Oxygen Delivery Method: Simple face mask Ventilation: Nasal airway inserted- appropriate to patient size

## 2017-04-21 NOTE — Anesthesia Post-op Follow-up Note (Signed)
Anesthesia QCDR form completed.        

## 2017-04-23 LAB — SURGICAL PATHOLOGY

## 2017-05-10 ENCOUNTER — Encounter: Payer: Self-pay | Admitting: Surgery

## 2017-05-11 ENCOUNTER — Telehealth: Payer: Self-pay | Admitting: Surgery

## 2017-05-11 NOTE — Telephone Encounter (Signed)
Left a message for the patient to call the office patient no showed appointment on 05/10/17 with Dr. Aleen CampiPiscoya. Please r/s if the patient calls back.

## 2017-06-02 ENCOUNTER — Encounter: Payer: Self-pay | Admitting: Surgery

## 2017-06-16 ENCOUNTER — Encounter: Payer: Self-pay | Admitting: Surgery

## 2017-06-17 ENCOUNTER — Encounter: Payer: Self-pay | Admitting: Surgery

## 2017-06-22 ENCOUNTER — Encounter: Payer: Self-pay | Admitting: Surgery

## 2017-06-24 ENCOUNTER — Telehealth: Payer: Self-pay | Admitting: Surgery

## 2017-06-24 NOTE — Telephone Encounter (Signed)
Patient no showed appointment on 06/22/17 with Dr. Everlene FarrierPabon, I have mailed a letter for the patient to contact our office to r/s this no showed appointment. Please r/s if the patient calls back.
